# Patient Record
Sex: Female | Born: 1965 | Race: White | Hispanic: No | Marital: Married
Health system: Southern US, Community
[De-identification: ages and names within clinical notes are randomized; demographics above are authoritative.]

---

## 2007-03-07 ENCOUNTER — Encounter: Admission: RE | Admit: 2007-03-07 | Discharge: 2007-03-07 | Payer: Self-pay | Admitting: Unknown Physician Specialty

## 2008-04-24 ENCOUNTER — Encounter: Admission: RE | Admit: 2008-04-24 | Discharge: 2008-04-24 | Payer: Self-pay | Admitting: Obstetrics & Gynecology

## 2010-08-19 ENCOUNTER — Encounter: Admission: RE | Admit: 2010-08-19 | Discharge: 2010-08-19 | Payer: Self-pay | Admitting: Obstetrics & Gynecology

## 2013-11-14 ENCOUNTER — Other Ambulatory Visit: Payer: Self-pay | Admitting: Obstetrics & Gynecology

## 2013-11-14 DIAGNOSIS — R928 Other abnormal and inconclusive findings on diagnostic imaging of breast: Secondary | ICD-10-CM

## 2013-11-29 ENCOUNTER — Ambulatory Visit
Admission: RE | Admit: 2013-11-29 | Discharge: 2013-11-29 | Disposition: A | Discharge status: Home / Self Care | Payer: 59 | Source: Ambulatory Visit | Attending: Obstetrics & Gynecology | Admitting: Obstetrics & Gynecology

## 2013-11-29 ENCOUNTER — Ambulatory Visit: Payer: Self-pay

## 2013-11-29 DIAGNOSIS — R928 Other abnormal and inconclusive findings on diagnostic imaging of breast: Secondary | ICD-10-CM

## 2016-01-16 ENCOUNTER — Other Ambulatory Visit: Payer: Self-pay | Admitting: Obstetrics and Gynecology

## 2016-01-16 DIAGNOSIS — R928 Other abnormal and inconclusive findings on diagnostic imaging of breast: Secondary | ICD-10-CM

## 2016-01-23 ENCOUNTER — Ambulatory Visit
Admission: RE | Admit: 2016-01-23 | Discharge: 2016-01-23 | Disposition: A | Discharge status: Home / Self Care | Payer: BLUE CROSS/BLUE SHIELD | Source: Ambulatory Visit | Attending: Obstetrics and Gynecology | Admitting: Obstetrics and Gynecology

## 2016-01-23 ENCOUNTER — Other Ambulatory Visit: Payer: Self-pay

## 2016-01-23 DIAGNOSIS — R928 Other abnormal and inconclusive findings on diagnostic imaging of breast: Secondary | ICD-10-CM

## 2017-09-26 ENCOUNTER — Encounter: Payer: BLUE CROSS/BLUE SHIELD | Admitting: Sports Medicine

## 2017-10-31 IMAGING — MG MM DIAG BREAST TOMO UNI LEFT
4 series · 4 of 12 positions shown · non-contrast
Comparison: Previous exam(s).

CLINICAL DATA: Screening recall for a possible mass noted on the CC
view of the left breast.

EXAM:
2D DIGITAL DIAGNOSTIC UNILATERAL LEFT MAMMOGRAM WITH CAD AND ADJUNCT
TOMO

[L ML]
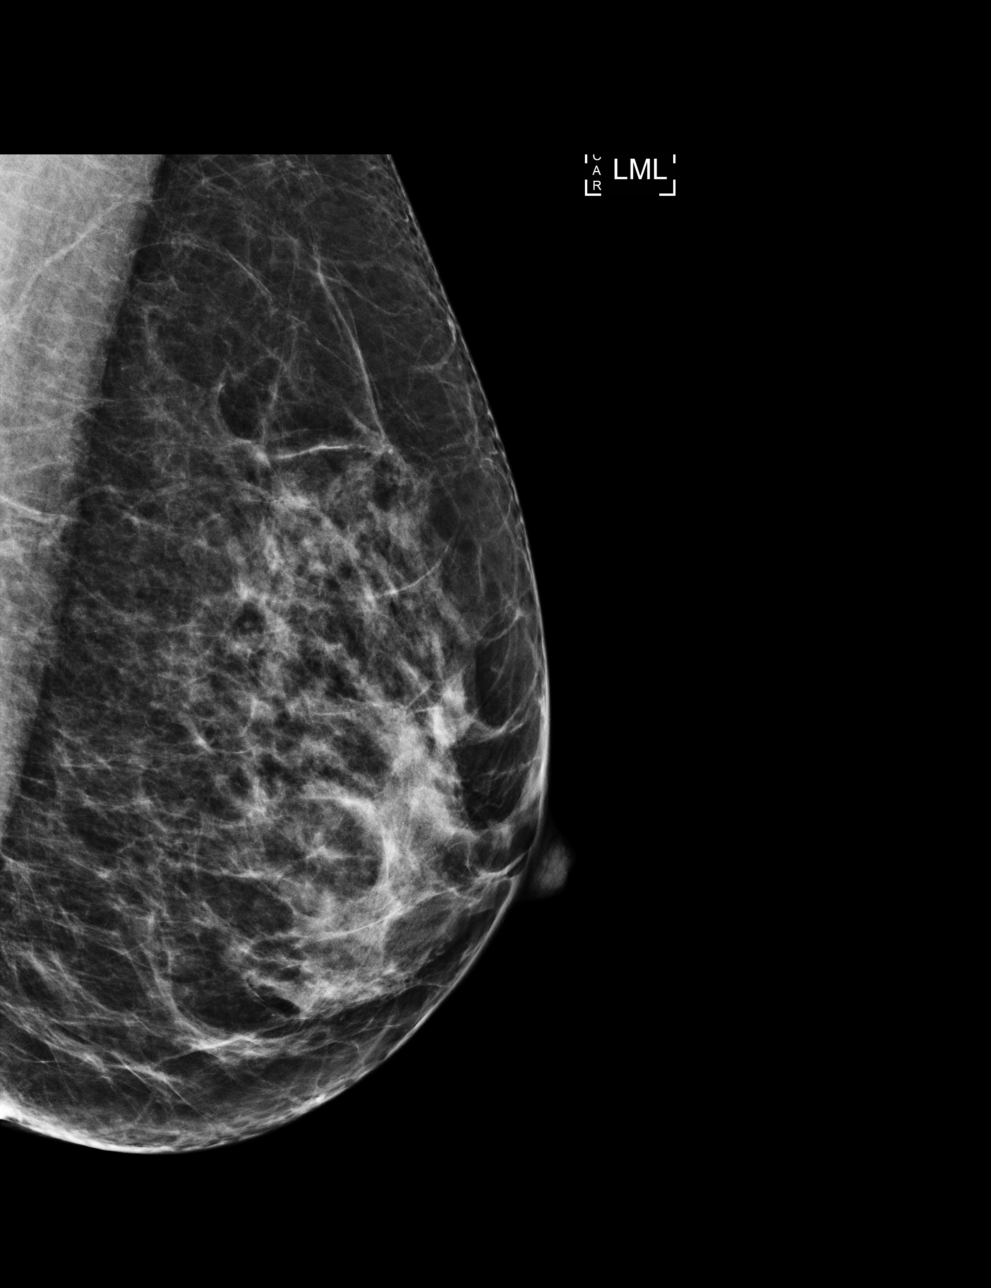

[L CC]
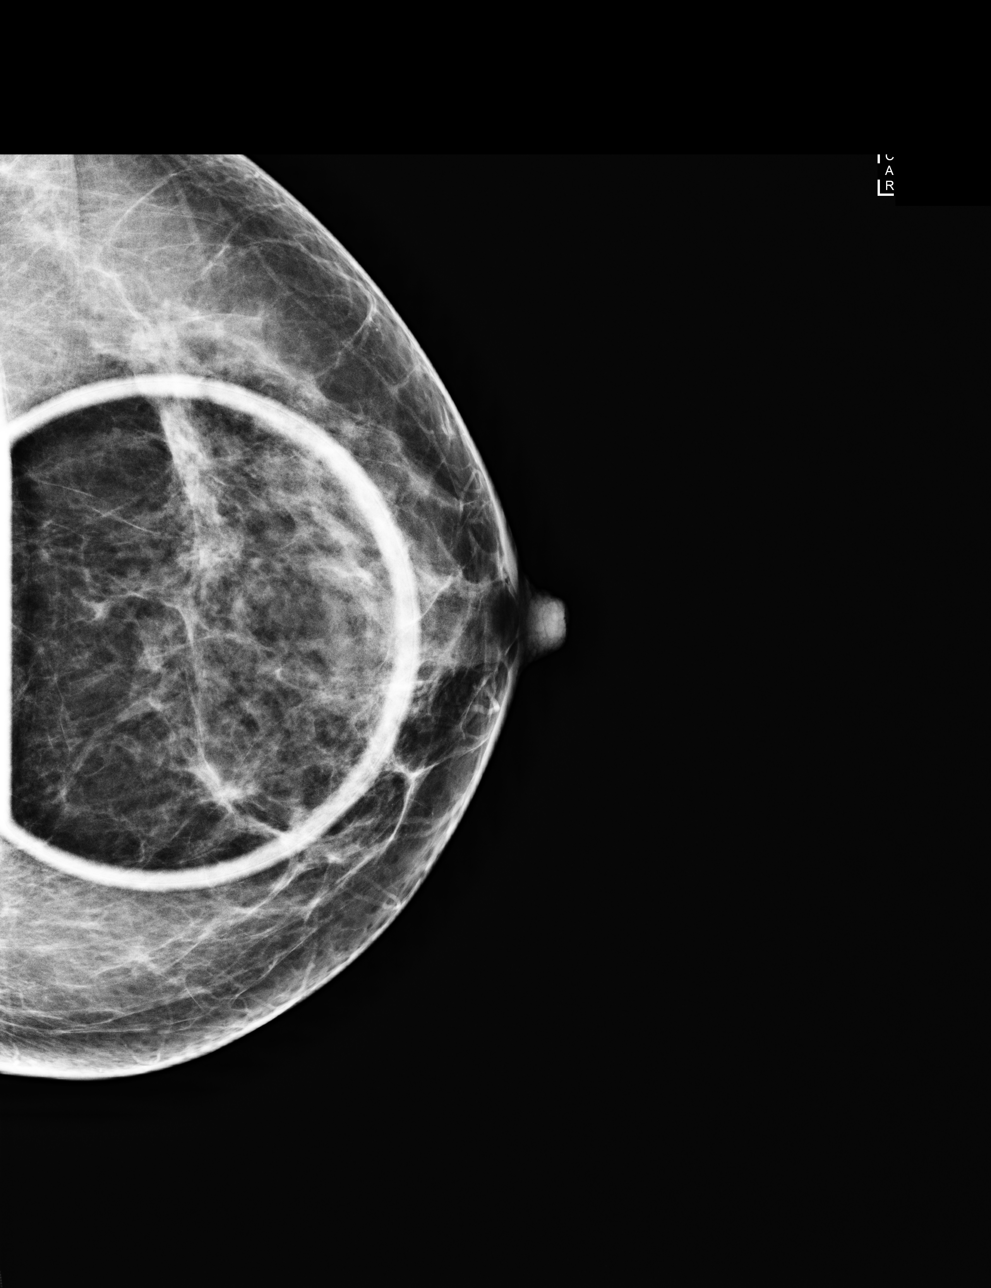

[L CC tomo · tomo slice 30/59.0]
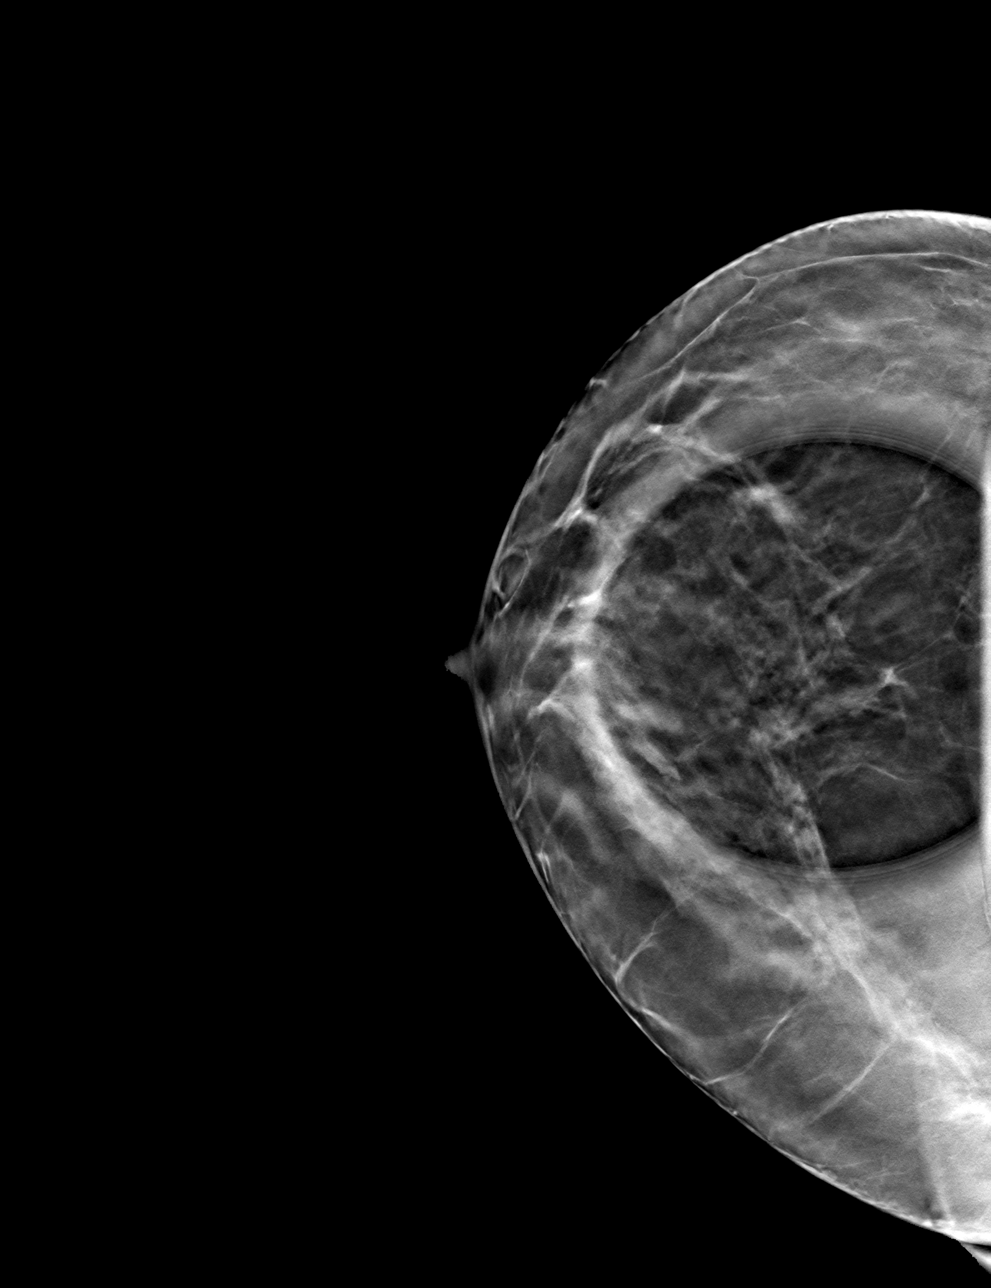

[L ML tomo · tomo slice 29/57.0]
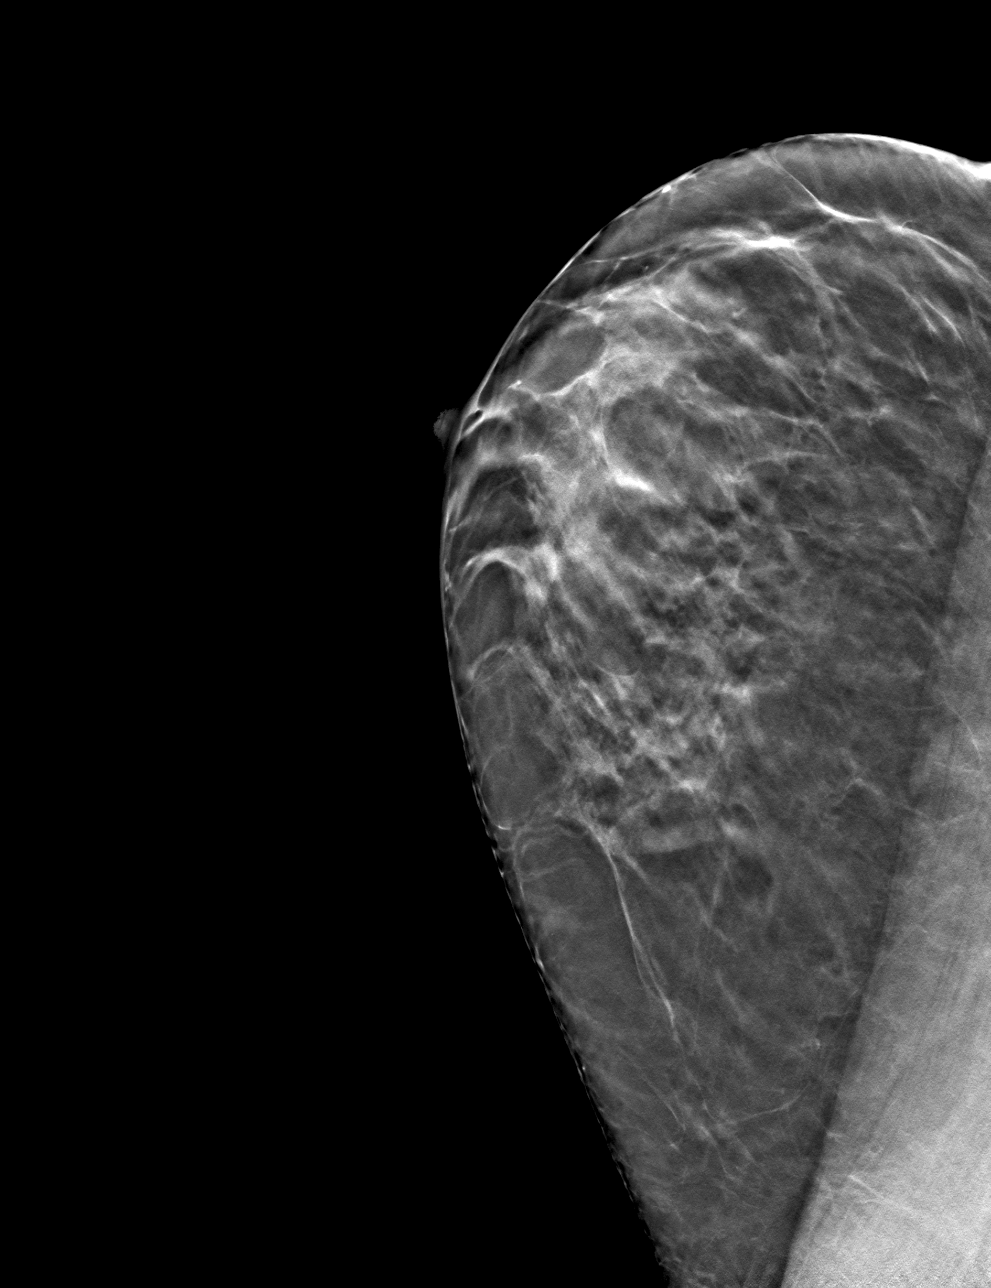

[4 of 12 positions shown; findings below may reference images not displayed]

ACR Breast Density Category c: The breast tissue is heterogeneously
dense, which may obscure small masses.
FINDINGS: On the CC spot-compression 2D and 3D images, the possible mass
disperses. There is no underlying mass or distortion. There are no
suspicious calcifications. No mass or abnormality is noted on the ML
view.

Mammographic images were processed with CAD.
IMPRESSION: No evidence of malignancy.

RECOMMENDATION:
Screening mammogram in one year.(Code:[id number])

I have discussed the findings and recommendations with the patient.
Results were also provided in writing at the conclusion of the
visit. If applicable, a reminder letter will be sent to the patient
regarding the next appointment.

BI-RADS CATEGORY  1: Negative.
